# Patient Record
Sex: Female | Born: 1975 | Race: White | Hispanic: No | Marital: Married | Smoking: Never smoker
Health system: Southern US, Community
[De-identification: ages and names within clinical notes are randomized; demographics above are authoritative.]

## PROBLEM LIST (undated history)

## (undated) DIAGNOSIS — K900 Celiac disease: Secondary | ICD-10-CM

## (undated) DIAGNOSIS — E063 Autoimmune thyroiditis: Secondary | ICD-10-CM

## (undated) DIAGNOSIS — N809 Endometriosis, unspecified: Secondary | ICD-10-CM

## (undated) HISTORY — PX: SHOULDER ARTHROSCOPY: SHX128

## (undated) HISTORY — DX: Autoimmune thyroiditis: E06.3

## (undated) HISTORY — DX: Endometriosis, unspecified: N80.9

## (undated) HISTORY — PX: KNEE SURGERY: SHX244

## (undated) HISTORY — PX: LAPAROSCOPY: SHX197

## (undated) HISTORY — DX: Celiac disease: K90.0

## (undated) HISTORY — PX: NASAL SEPTUM SURGERY: SHX37

---

## 1996-07-14 HISTORY — PX: DIAGNOSTIC LAPAROSCOPY: SUR761

## 1998-01-24 ENCOUNTER — Ambulatory Visit (HOSPITAL_COMMUNITY): Admission: RE | Admit: 1998-01-24 | Discharge: 1998-01-24 | Payer: Self-pay | Admitting: Obstetrics and Gynecology

## 1999-01-28 ENCOUNTER — Other Ambulatory Visit: Admission: RE | Admit: 1999-01-28 | Discharge: 1999-01-28 | Payer: Self-pay | Admitting: Obstetrics and Gynecology

## 1999-11-07 ENCOUNTER — Other Ambulatory Visit: Admission: RE | Admit: 1999-11-07 | Discharge: 1999-11-07 | Payer: Self-pay | Admitting: Obstetrics and Gynecology

## 2000-11-09 ENCOUNTER — Other Ambulatory Visit: Admission: RE | Admit: 2000-11-09 | Discharge: 2000-11-09 | Payer: Self-pay | Admitting: Obstetrics and Gynecology

## 2003-01-03 ENCOUNTER — Other Ambulatory Visit: Admission: RE | Admit: 2003-01-03 | Discharge: 2003-01-03 | Payer: Self-pay | Admitting: Obstetrics and Gynecology

## 2003-04-24 ENCOUNTER — Other Ambulatory Visit: Admission: RE | Admit: 2003-04-24 | Discharge: 2003-04-24 | Payer: Self-pay | Admitting: Obstetrics and Gynecology

## 2003-11-27 ENCOUNTER — Other Ambulatory Visit: Admission: RE | Admit: 2003-11-27 | Discharge: 2003-11-27 | Payer: Self-pay | Admitting: Obstetrics and Gynecology

## 2004-10-03 ENCOUNTER — Ambulatory Visit (HOSPITAL_COMMUNITY): Admission: RE | Admit: 2004-10-03 | Discharge: 2004-10-03 | Payer: Self-pay | Admitting: Obstetrics and Gynecology

## 2005-04-21 ENCOUNTER — Ambulatory Visit: Payer: Self-pay | Admitting: Internal Medicine

## 2005-07-03 ENCOUNTER — Ambulatory Visit: Payer: Self-pay | Admitting: Internal Medicine

## 2005-07-08 ENCOUNTER — Ambulatory Visit: Payer: Self-pay | Admitting: Internal Medicine

## 2005-07-08 ENCOUNTER — Ambulatory Visit: Payer: Self-pay | Admitting: Family Medicine

## 2005-08-26 ENCOUNTER — Other Ambulatory Visit: Admission: RE | Admit: 2005-08-26 | Discharge: 2005-08-26 | Payer: Self-pay | Admitting: Obstetrics and Gynecology

## 2005-10-27 ENCOUNTER — Encounter: Admission: RE | Admit: 2005-10-27 | Discharge: 2005-10-27 | Payer: Self-pay | Admitting: Obstetrics and Gynecology

## 2006-09-30 ENCOUNTER — Ambulatory Visit (HOSPITAL_BASED_OUTPATIENT_CLINIC_OR_DEPARTMENT_OTHER): Admission: RE | Admit: 2006-09-30 | Discharge: 2006-09-30 | Payer: Self-pay | Admitting: Otolaryngology

## 2006-09-30 ENCOUNTER — Encounter (INDEPENDENT_AMBULATORY_CARE_PROVIDER_SITE_OTHER): Payer: Self-pay | Admitting: Specialist

## 2007-04-16 DIAGNOSIS — J309 Allergic rhinitis, unspecified: Secondary | ICD-10-CM | POA: Insufficient documentation

## 2009-11-22 ENCOUNTER — Encounter: Admission: RE | Admit: 2009-11-22 | Discharge: 2009-11-22 | Payer: Self-pay | Admitting: *Deleted

## 2010-11-29 NOTE — Op Note (Signed)
NAME:  Christina Donaldson, Christina Donaldson              ACCOUNT NO.:  [id number]   MEDICAL RECORD NO.:  [id number]          PATIENT TYPE:  AMB   LOCATION:  SDC                           FACILITY:  [facility]   PHYSICIAN:  Malva Limes, M.D.    DATE OF BIRTH:  [birth year]   DATE OF PROCEDURE:  10/03/2004  DATE OF DISCHARGE:                                 OPERATIVE REPORT   PREOPERATIVE DIAGNOSES:  1.  Pelvic pain.  2.  Dyspareunia.  3.  History of endometriosis.   POSTOPERATIVE DIAGNOSES:  1.  Pelvic pain.  2.  Dyspareunia.  3.  History of endometriosis.  4.  Pelvic congestion syndrome.  5.  Minimal endometriosis.   PROCEDURE:  1.  Diagnostic laparoscopy.  2.  Cautery of minimal endometriosis.  3.  Laparoscopic uterosacral nerve ablation.   SURGEON:  Malva Limes, M.D.   ANESTHESIA:  General endotracheal.   ANESTHESIA:  Ancef 1 g.   ESTIMATED BLOOD LOSS:  Minimal.   COMPLICATIONS:  None.   SPECIMENS:  None.   DRAINS:  Red rubber catheter bladder.   FINDINGS:  The patient had a minimal amount of endometriosis on the left  pelvic sidewall, also in the right posterior cul-de-sac otherwise no  evidence of endometriosis was found. The patient had a very large dilated  infundibulopelvic ligament bilaterally consistent with pelvic congestion  syndrome. The uterus, fallopian tubes and ovaries all appear to be normal.   DESCRIPTION OF PROCEDURE:  The patient was taken to the operating room where  she was placed in the dorsal supine position. A general anesthetic was  administered without complications. She was then placed in dorsal lithotomy  position, her bladder was drained with a red rubber catheter. Her umbilicus  was then injected with 0.25% Marcaine, the hulka tenaculum was applied to  the anterior cervical lip. A vertical skin incision was made through the  anterior cervical lip. A vertical skin incision was made through the  previous scar, the fascia was grasped, entered the Mayo's,  parietoperitoneum  was entered sharply, sutures were placed bilaterally and then the Hasson  tenaculum placed into the peritoneal cavity. Three liters of carbon dioxide  was then insufflated. The patient was placed in Trendelenburg, a 5 mm port  was placed in the suprapubic region under direct visualization. At this  point, the liver and gallbladder were examined and appeared to be normal,  the appendix was normal and the other findings were as noted above. At this  point, the Kleppinger was set up and areas of endometriosis all cauterized.  Because of the minimal amount of endometriosis and the patient's complaints,  it was felt that she might benefit from a laparoscopic uterosacral nerve  ablation and this was performed with the Kleppinger forceps. Following this,  a thorough examination was performed everywhere and no evidence of any other  endometriosis was noted. This concluded the procedure. At this point, the  instruments removed, pneumoperitoneum released, the fascia was closed with  interrupted #0 Vicryl suture, the skin closed interrupted 4-0 Vicryl suture.  The patient was taken to the recovery room in stable condition.  Instrument  and lap counts were correct x2. The patient was sent home with Percocet to  take p.r.n.      MA/MEDQ  D:  10/03/2004  T:  10/03/2004  Job:  [id number]

## 2010-11-29 NOTE — Op Note (Signed)
Christina Donaldson, Christina Donaldson              ACCOUNT NO.:  [id number]   MEDICAL RECORD NO.:  [id number]          PATIENT TYPE:  AMB   LOCATION:  DSC                          FACILITY:  [facility]   PHYSICIAN:  Jefry H. Pollyann Kennedy, MD     DATE OF BIRTH:  27-May-1976   DATE OF PROCEDURE:  09/30/2006  DATE OF DISCHARGE:                               OPERATIVE REPORT   PREOPERATIVE DIAGNOSES:  1. Nasal septal deviation.  2. Inferior turbinate hypertrophy.  3. Right concha bullosa with obstruction.   POSTOPERATIVE DIAGNOSES:  1. Nasal septal deviation.  2. Inferior turbinate hypertrophy.  3. Right concha bullosa with obstruction.   PROCEDURE:  1. Nasal septoplasty.  2. Submucous resection inferior turbinates bilaterally.  3. Right concha bullosa resection.   SURGEON:  Jefry H. Pollyann Kennedy, MD   ANESTHESIA:  General endotracheal anesthesia was used.   COMPLICATIONS:  No complications.   ESTIMATED BLOOD LOSS:  Minimal.   REFERRING PHYSICIAN:  Dr. Fabian Sharp.   FINDINGS:  Significant leftward septal deviation with obstruction on the  left, large inferior turbinates bilaterally with bony hyperplasia, very  large concha bullosa right middle turbinate causing obstruction on the  right side.   HISTORY:  A 35 year old with a long history of chronic nasal obstruction  and sinus symptoms.  Risks, benefits, alternatives, complications of  procedure explained to the patient.  She seemed to understand and agreed  to surgery.   PROCEDURE:  The patient is a the operating room, placed the operating  table in supine position.  Following induction of general endotracheal  anesthesia the patient was prepped and draped in standard fashion.  Oxymetazoline spray was used preoperatively in the nasal cavities and  then throughout the case on pledgets.  Xylocaine with epinephrine was  infiltrated into the septum bilaterally, the columella, the inferior  turbinates and the superior and posterior attachments of the middle  turbinate on the right.  1. Nasal septoplasty.  A left hemitransfixion incision was created      with 15 scalpel and a mucoperichondrial flap was developed      posteriorly down the left side of the nasal septum.  The bony      cartilaginous junction was divided and a similar flap was developed      down the right side.  The entire ethmoid plate was deflected to the      left side causing the majority of the obstruction and this was      resected using an open Laren Boom rongeur.  The anterior half      of the vomer was also deflected to the left causing large spur and      this was resected as well.  The quadrangular cartilage was in good      position and was left intact.  The mucosal incision was      reapproximated with a chromic suture.  2. Submucous resection inferior turbinates bilaterally.  The leading      edge of the inferior turbinates was incised in a vertical fashion      with a 15 scalpel.  A Cottle elevator  was used to elevate mucosa      off bone and large fragments of bone were resected left side being      larger than the right.  Turbinate remnants were outfractured with a      Therapist, nutritional.  This greatly increased the inferior meatus airways      bilaterally.  3. Right concha bullosa resection.  Using a combination of the      endoscopic through cut forceps and turbinectomy scissors the      inferior two-thirds and anterior aspect of the middle turbinate was      resected.  There was a large air sinus contained within.  The      lateral lamella was resected all the way back to the ground lamella      in order to expose the infundibulum to prevent any postoperative      obstruction.  The medial lamella was resected back as far as      necessary in order to allow the septum to lie in a nice midline      position without touching the turbinate.  The nasal cavities were      suctioned of blood and secretions, packed with rolled up Telfa      coated with bacitracin.   Pharynx was suctioned as well.  The      patient was awakened, extubated, transferred to recovery in stable      condition.      Jefry H. Pollyann Kennedy, MD  Electronically Signed     JHR/MEDQ  D:  09/30/2006  T:  09/30/2006  Job:  [id number]   cc:   Neta Mends. Fabian Sharp, MD

## 2011-03-20 ENCOUNTER — Ambulatory Visit (HOSPITAL_BASED_OUTPATIENT_CLINIC_OR_DEPARTMENT_OTHER)
Admission: RE | Admit: 2011-03-20 | Discharge: 2011-03-20 | Disposition: A | Discharge status: Home / Self Care | Payer: 59 | Source: Ambulatory Visit | Attending: Orthopedic Surgery | Admitting: Orthopedic Surgery

## 2011-03-20 DIAGNOSIS — M6750 Plica syndrome, unspecified knee: Secondary | ICD-10-CM | POA: Insufficient documentation

## 2011-03-20 DIAGNOSIS — Z01812 Encounter for preprocedural laboratory examination: Secondary | ICD-10-CM | POA: Insufficient documentation

## 2011-03-20 DIAGNOSIS — M2340 Loose body in knee, unspecified knee: Secondary | ICD-10-CM | POA: Insufficient documentation

## 2011-03-20 LAB — POCT HEMOGLOBIN-HEMACUE: Hemoglobin: 12.2 g/dL (ref 12.0–15.0)

## 2011-03-25 NOTE — Op Note (Signed)
  NAME:  WANEDA, KLAMMER NO.:  [id number]  MEDICAL RECORD NO.:  [id number]  LOCATION:                                 FACILITY:  PHYSICIAN:  Loreta Ave, M.D.      DATE OF BIRTH:  DATE OF PROCEDURE:  03/20/2011 DATE OF DISCHARGE:                              OPERATIVE REPORT   PREOPERATIVE DIAGNOSIS:  Left knee symptomatic medial plica, question loose body.  POSTOPERATIVE DIAGNOSIS:  Left knee symptomatic medial plica with abrasive changes, medial femoral condyle.  Calcific loose body, anterior recess.  PROCEDURE:  Left knee exam under anesthesia, arthroscopy.  Excision of medial plica, partial synovectomy.  Chondroplasty, medial femoral condyle.  Excision of calcific loose fragments at the anterior aspect of the knee.  SURGEON:  Loreta Ave, M.D.  ASSISTANT:  Genene Churn. Barry Dienes, [place].  ANESTHESIA:  General.  BLOOD LOSS:  Minimal.  SPECIMENS:  None.  COMPLICATIONS:  None.  DRESSING:  Soft compressive.  PROCEDURE IN DETAIL:  The patient was brought to the operating room and placed on the operating table in supine position.  After adequate anesthesia was obtained, knee examined.  Good motion and stability.  Leg holder applied.  Leg prepped and draped in usual sterile fashion.  Two portals, one each medial lateral parapatellar.  Arthroscope was induced. Knee distended and inspected.  Intact articular cartilage throughout except for the margin medial femoral condyle, which was rubbed and abraded by a fibrotic medial plica and synovitis anteromedially.  Plica excised in its entirety with partial synovectomy.  Hemostasis with cautery.  Superficial chondroplasty of the condyle.  Medial meniscus, medial compartment, lateral meniscus, lateral compartment, cruciate ligaments, and patellofemoral tracking all looked good.  In the anterior aspect of the knee on the back of the fat pad, there was a calcific area of tissue that was completely removed with  shaver.  No other osseous loose bodies were seen.  The entire knee examined to be sure there were no other loose fragments.  I then looked fluoroscopically in the knee where we could see the calcific density on preop films and was gone after excision of the calcific material mentioned above.  Instruments and fluid removed.  Knee injected Depo- Medrol and Marcaine.  Portals closed with nylon.  Sterile compressive dressing applied.  Anesthesia reversed.  Brought to the room.  Tolerated surgery well.  No complications.     Loreta Ave, M.D.     DFM/MEDQ  D:  03/20/2011  T:  03/20/2011  Job:  [id number]  Electronically Signed by Mckinley Jewel M.D. on 03/25/2011 02:47:32 PM

## 2011-04-16 ENCOUNTER — Other Ambulatory Visit: Payer: Self-pay | Admitting: Obstetrics and Gynecology

## 2013-05-10 ENCOUNTER — Other Ambulatory Visit: Payer: Self-pay | Admitting: Obstetrics and Gynecology

## 2013-11-03 ENCOUNTER — Other Ambulatory Visit: Payer: Self-pay

## 2014-05-22 ENCOUNTER — Other Ambulatory Visit: Payer: Self-pay | Admitting: Obstetrics and Gynecology

## 2014-05-23 LAB — CYTOLOGY - PAP

## 2015-06-26 ENCOUNTER — Other Ambulatory Visit: Payer: Self-pay | Admitting: Obstetrics and Gynecology

## 2015-06-27 LAB — CYTOLOGY - PAP

## 2016-07-01 ENCOUNTER — Other Ambulatory Visit: Payer: Self-pay | Admitting: Obstetrics and Gynecology

## 2016-07-01 DIAGNOSIS — E039 Hypothyroidism, unspecified: Secondary | ICD-10-CM | POA: Insufficient documentation

## 2016-07-03 ENCOUNTER — Other Ambulatory Visit: Payer: Self-pay | Admitting: Obstetrics and Gynecology

## 2016-07-03 DIAGNOSIS — R928 Other abnormal and inconclusive findings on diagnostic imaging of breast: Secondary | ICD-10-CM

## 2016-07-03 LAB — CYTOLOGY - PAP

## 2016-07-11 ENCOUNTER — Ambulatory Visit
Admission: RE | Admit: 2016-07-11 | Discharge: 2016-07-11 | Disposition: A | Discharge status: Home / Self Care | Payer: BLUE CROSS/BLUE SHIELD | Source: Ambulatory Visit | Attending: Obstetrics and Gynecology | Admitting: Obstetrics and Gynecology

## 2016-07-11 ENCOUNTER — Ambulatory Visit
Admission: RE | Admit: 2016-07-11 | Discharge: 2016-07-11 | Disposition: A | Discharge status: Home / Self Care | Payer: Self-pay | Source: Ambulatory Visit | Attending: Obstetrics and Gynecology | Admitting: Obstetrics and Gynecology

## 2016-07-11 DIAGNOSIS — R928 Other abnormal and inconclusive findings on diagnostic imaging of breast: Secondary | ICD-10-CM

## 2017-07-22 DIAGNOSIS — K589 Irritable bowel syndrome without diarrhea: Secondary | ICD-10-CM | POA: Insufficient documentation

## 2017-11-20 IMAGING — MG 2D DIGITAL DIAGNOSTIC UNILATERAL LEFT MAMMOGRAM WITH CAD AND ADJ
4 series · 4 of 12 positions shown · non-contrast
Comparison: 07/01/2016 and earlier priors dated 05/22/2014 and
05/10/2013

CLINICAL DATA: Asymmetry in the medial left breast identified on
recent screening mammogram.

EXAM:
2D DIGITAL DIAGNOSTIC UNILATERAL LEFT MAMMOGRAM WITH CAD AND ADJUNCT
TOMO

[L CC]
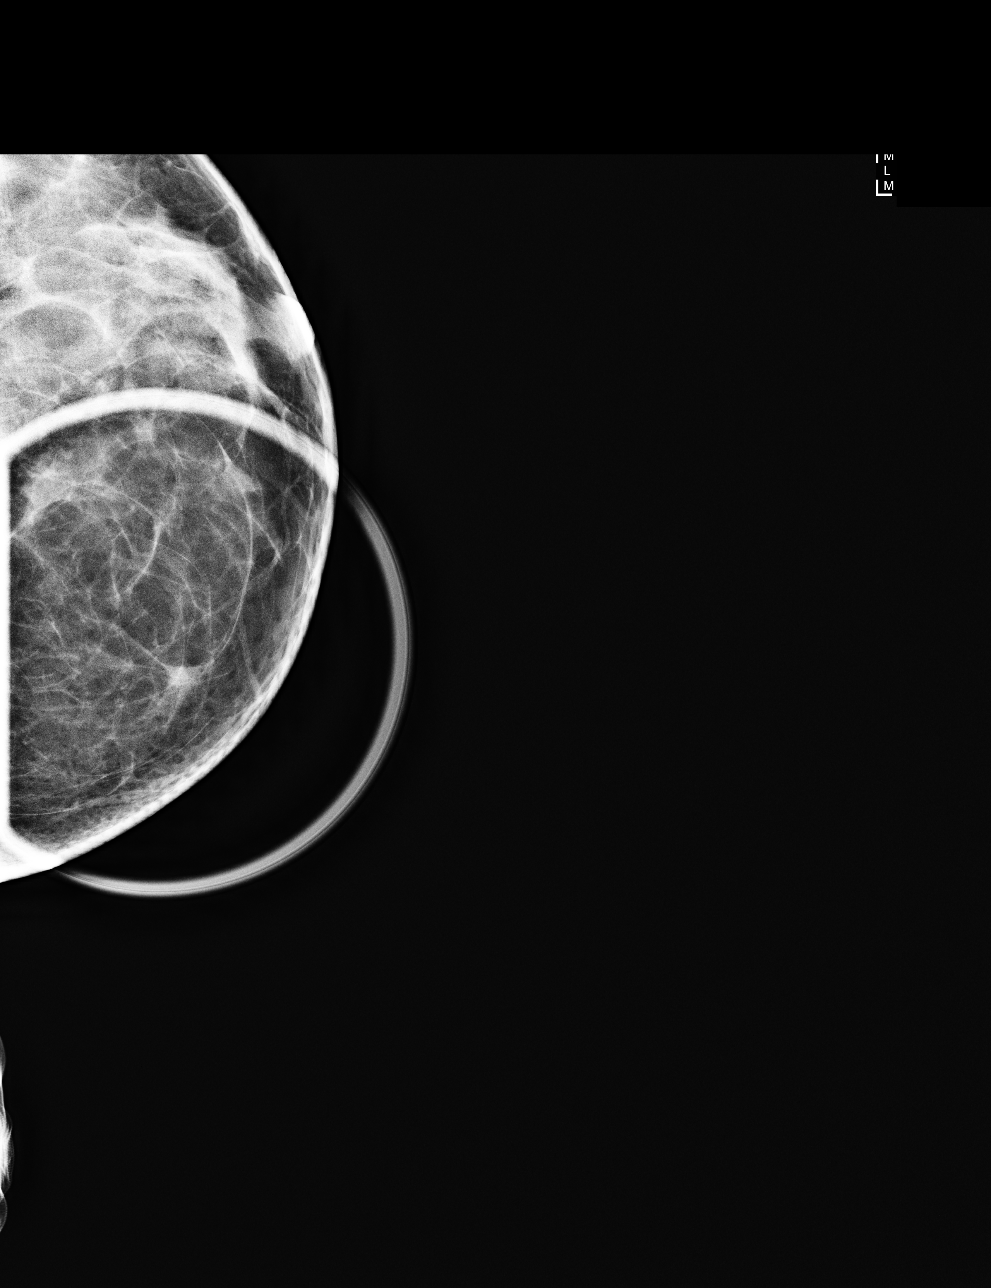

[L ML]
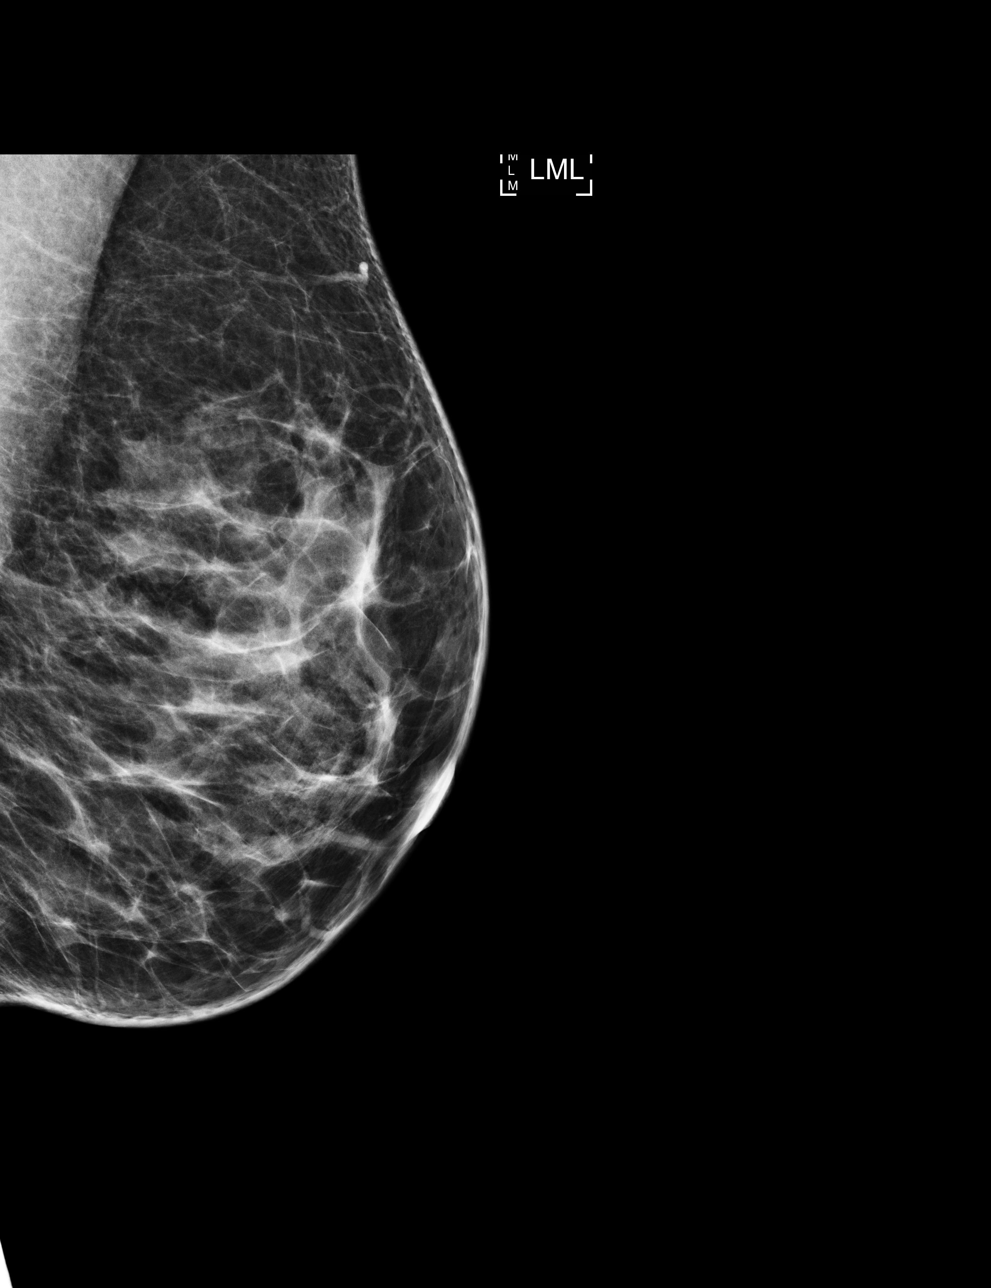

[L CC tomo · tomo slice 27/53.0]
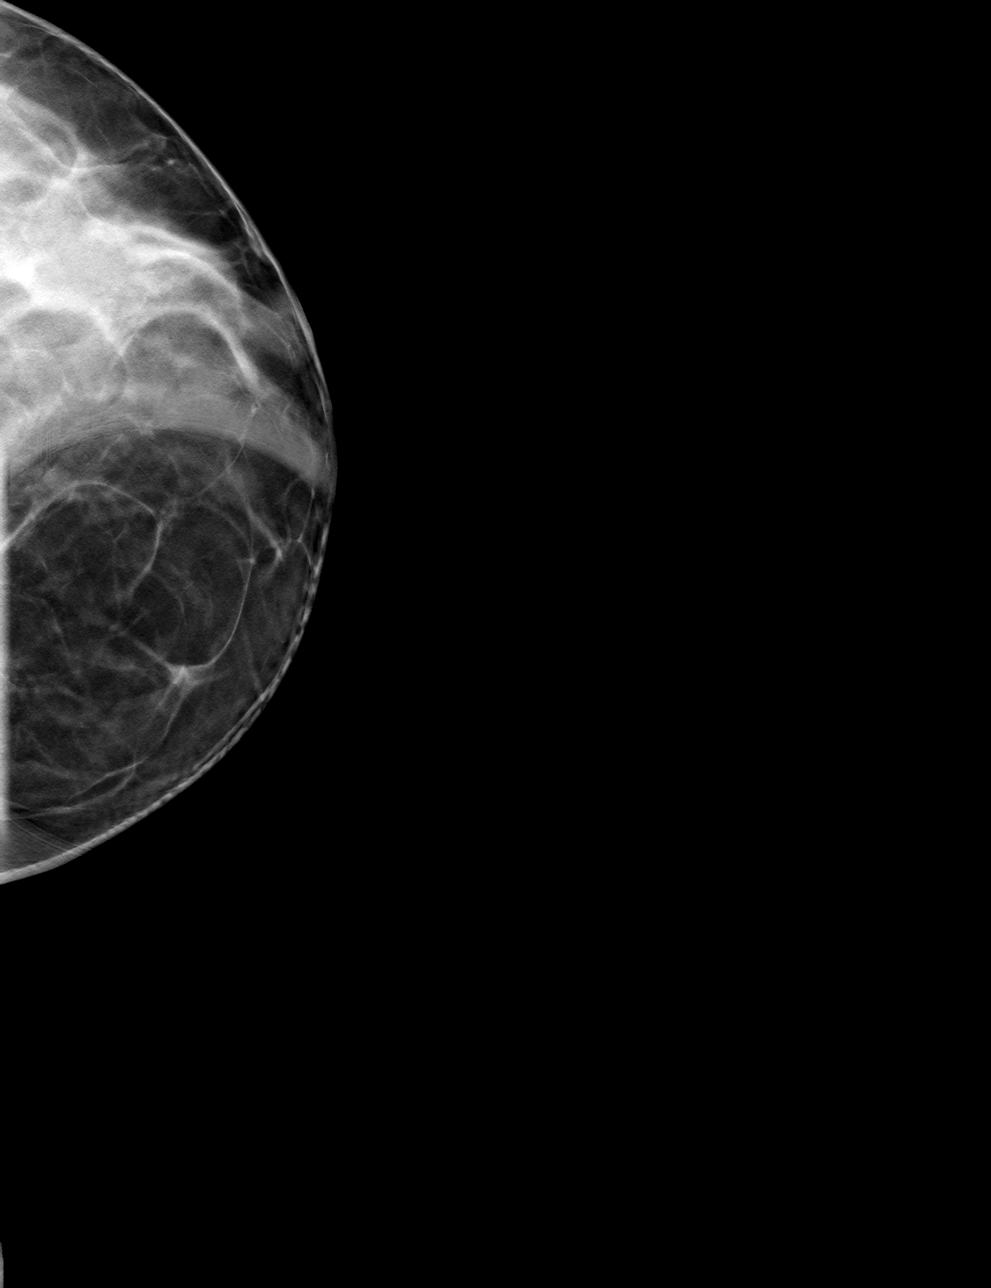

[L ML tomo · tomo slice 32/63.0]
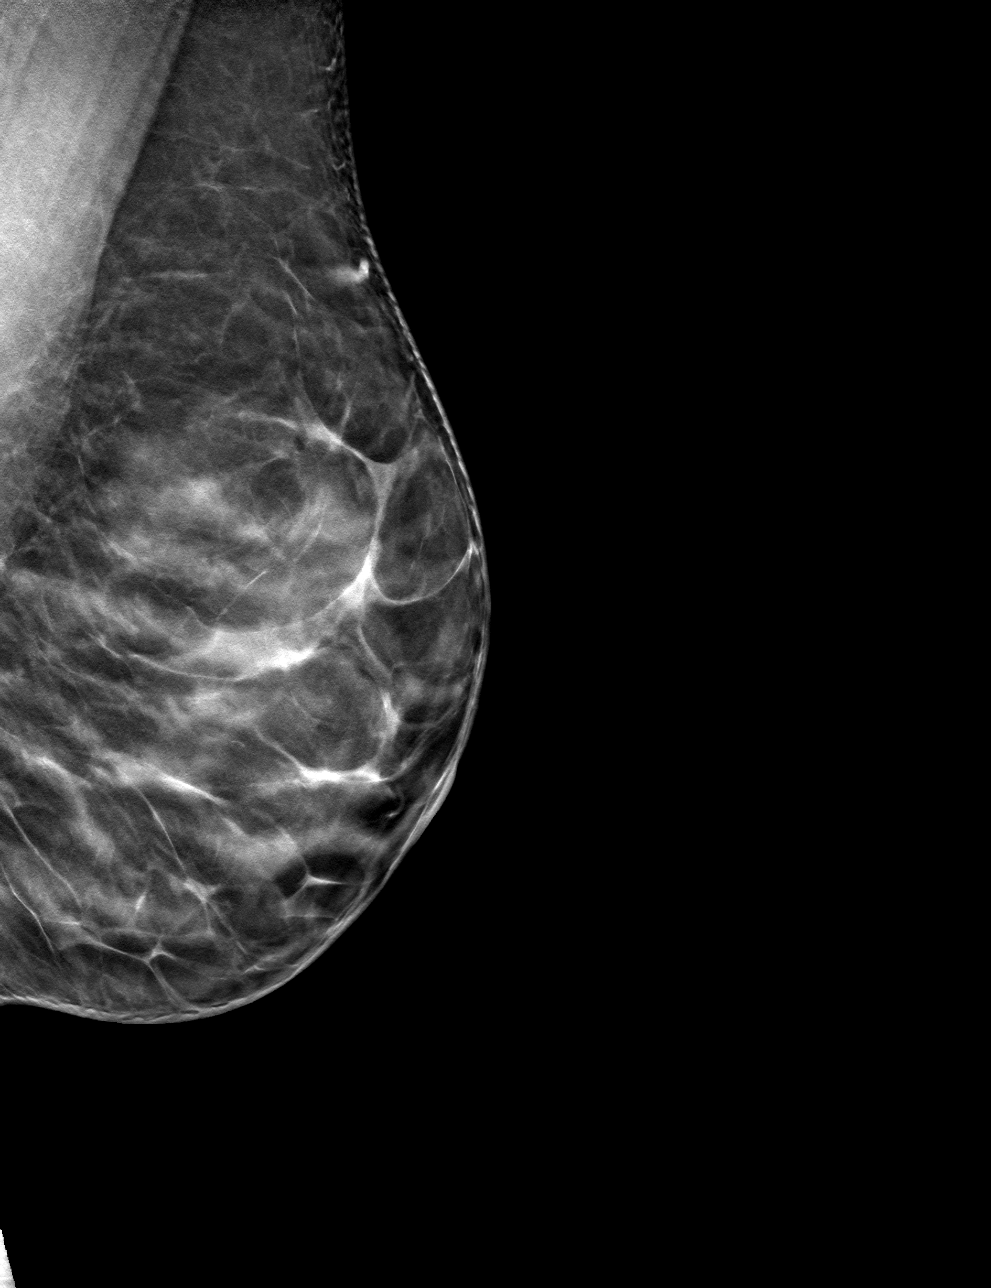

[4 of 12 positions shown; findings below may reference images not displayed]

ACR Breast Density Category c: The breast tissue is heterogeneously
dense, which may obscure small masses.
FINDINGS: Focal spot compression view of the medial left breast with
tomography and 90 degrees lateral view of the left breast with
tomography show normal fibroglandular densities without mass or
distortion. No findings to suggest malignancy.

Mammographic images were processed with CAD.
IMPRESSION: No evidence of malignancy in the left breast.

RECOMMENDATION:
Screening mammogram in one year.(Code:[id number]-[id number])

I have discussed the findings and recommendations with the patient.
Results were also provided in writing at the conclusion of the
visit. If applicable, a reminder letter will be sent to the patient
regarding the next appointment.

BI-RADS CATEGORY  1: Negative.

## 2018-08-09 ENCOUNTER — Other Ambulatory Visit: Payer: Self-pay | Admitting: Podiatry

## 2018-08-09 ENCOUNTER — Encounter: Payer: Self-pay | Admitting: Podiatry

## 2018-08-09 ENCOUNTER — Ambulatory Visit (INDEPENDENT_AMBULATORY_CARE_PROVIDER_SITE_OTHER): Payer: Managed Care, Other (non HMO) | Admitting: Podiatry

## 2018-08-09 ENCOUNTER — Ambulatory Visit (INDEPENDENT_AMBULATORY_CARE_PROVIDER_SITE_OTHER): Payer: Managed Care, Other (non HMO)

## 2018-08-09 VITALS — BP 111/69 | HR 79 | Resp 16

## 2018-08-09 DIAGNOSIS — M25572 Pain in left ankle and joints of left foot: Secondary | ICD-10-CM

## 2018-08-09 DIAGNOSIS — M79672 Pain in left foot: Principal | ICD-10-CM

## 2018-08-09 DIAGNOSIS — M779 Enthesopathy, unspecified: Secondary | ICD-10-CM

## 2018-08-09 DIAGNOSIS — M79671 Pain in right foot: Secondary | ICD-10-CM

## 2018-08-09 MED ORDER — TRIAMCINOLONE ACETONIDE 10 MG/ML IJ SUSP
10.0000 mg | Freq: Once | INTRAMUSCULAR | Status: AC
  Administered 2018-08-09: 10 mg

## 2018-08-09 NOTE — Progress Notes (Signed)
   Subjective:    Patient ID: Christina Donaldson, female    DOB: Dec 19, 1975, 43 y.o.   MRN: [id number]  HPI    Review of Systems  All other systems reviewed and are negative.      Objective:   Physical Exam        Assessment & Plan:

## 2018-08-10 ENCOUNTER — Telehealth: Payer: Self-pay | Admitting: Podiatry

## 2018-08-10 NOTE — Telephone Encounter (Signed)
Pt was seen yesterday and she was suppose to receive an anti-inflammatory medication at her pharmacy

## 2018-08-11 MED ORDER — DICLOFENAC SODIUM 75 MG PO TBEC: 75.0000 mg | DELAYED_RELEASE_TABLET | Freq: Two times a day (BID) | ORAL | 0 refills | Status: DC

## 2018-08-11 NOTE — Telephone Encounter (Signed)
voltaren

## 2018-08-11 NOTE — Telephone Encounter (Signed)
I informed pt of Dr. Regal's orders. 

## 2018-08-11 NOTE — Addendum Note (Signed)
Addended by: Alphia Kava D on: 08/11/2018 11:59 AM   Modules accepted: Orders

## 2018-08-11 NOTE — Progress Notes (Signed)
Subjective:   Patient ID: Christina Donaldson, female   DOB: 43 y.o.   MRN: [id number]   HPI Patient presents stating she is been having a lot of pain in her left ankle outside of her foot.  States that it is been hurting her for several months and is gradually worsened over that time it is difficult to wear shoe gear with comfortably.  Patient does not smoke and likes to be active   Review of Systems  All other systems reviewed and are negative.       Objective:  Physical Exam Vitals signs and nursing note reviewed.  Constitutional:      Appearance: She is well-developed.  Pulmonary:     Effort: Pulmonary effort is normal.  Musculoskeletal: Normal range of motion.  Skin:    General: Skin is warm.  Neurological:     Mental Status: She is alert.     Neurovascular status was found to be intact muscle strength is adequate range of motion within normal limits with exquisite discomfort in the sinus tarsi left outside of the left foot with pain around the head of the fifth metatarsal with fluid buildup around this area.  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  Patient will be seen back for [place] to recheck  X-rays indicate that there is no indications that there is an arthritic process here and it appears to  be inflammatory with mild depression of the arch

## 2018-08-26 ENCOUNTER — Encounter: Payer: Self-pay | Admitting: Podiatry

## 2018-08-26 NOTE — Telephone Encounter (Signed)
Pt sent MyChart message stating have adverse effects to Diclofenac.

## 2018-08-31 ENCOUNTER — Other Ambulatory Visit: Payer: Self-pay | Admitting: Podiatry

## 2018-09-06 ENCOUNTER — Encounter: Payer: Self-pay | Admitting: Podiatry

## 2018-09-06 ENCOUNTER — Ambulatory Visit (INDEPENDENT_AMBULATORY_CARE_PROVIDER_SITE_OTHER): Payer: Managed Care, Other (non HMO) | Admitting: Podiatry

## 2018-09-06 DIAGNOSIS — M779 Enthesopathy, unspecified: Secondary | ICD-10-CM | POA: Diagnosis not present

## 2018-09-06 DIAGNOSIS — M25572 Pain in left ankle and joints of left foot: Secondary | ICD-10-CM

## 2018-09-06 NOTE — Progress Notes (Signed)
Subjective:   Patient ID: Christina Donaldson, female   DOB: 43 y.o.   MRN: [id number]   HPI Patient presents stating the ankle is improved but I continue to develop pain in my forefoot of both feet and also around the bones on the outside at times can be tender   ROS      Objective:  Physical Exam  Neurovascular status intact with patient having nondescript forefoot pain bilateral and mild discomfort around the fifth metatarsal head bilateral with diminished discomfort in the sinus tarsi     Assessment:  Inflammatory condition that is improving but patient continues to have overall foot pain     Plan:  H&P reviewed condition and I have recommended orthotics to try to redistribute the weightbearing patterns of both feet.  I do think that the ped orthotist should evaluate her along with her shoe gear choices to make a decision at what will be best but I do think a softer type device to try to redistribute weight will be best for her.  I advised and discussed this with Elynor at great length and she is scheduled for this to be done

## 2018-09-29 ENCOUNTER — Other Ambulatory Visit: Payer: Self-pay

## 2018-09-29 ENCOUNTER — Ambulatory Visit: Payer: Managed Care, Other (non HMO) | Admitting: Orthotics

## 2018-09-29 DIAGNOSIS — M7751 Other enthesopathy of right foot: Secondary | ICD-10-CM

## 2018-09-29 DIAGNOSIS — M25572 Pain in left ankle and joints of left foot: Secondary | ICD-10-CM

## 2018-09-29 DIAGNOSIS — M79671 Pain in right foot: Secondary | ICD-10-CM

## 2018-09-29 DIAGNOSIS — M216X2 Other acquired deformities of left foot: Secondary | ICD-10-CM | POA: Diagnosis not present

## 2018-09-29 DIAGNOSIS — M7752 Other enthesopathy of left foot: Secondary | ICD-10-CM

## 2018-09-29 DIAGNOSIS — M216X1 Other acquired deformities of right foot: Secondary | ICD-10-CM

## 2018-09-29 DIAGNOSIS — M779 Enthesopathy, unspecified: Secondary | ICD-10-CM

## 2018-09-29 DIAGNOSIS — M79672 Pain in left foot: Secondary | ICD-10-CM

## 2018-09-29 NOTE — Progress Notes (Signed)
Patient came into today to be cast for [employer]. Upon recommendation of Dr. Charlsie Merles  Patient presents with moderatly high arch, lateral discomfort L > R, and evidence of Sinus Tarsi syndrome. Goals are offer semi rigid device with arch support as well as wide base and lateral valgus wedging.  Plan vendor [place]

## 2018-10-21 ENCOUNTER — Other Ambulatory Visit: Payer: Managed Care, Other (non HMO) | Admitting: Orthotics

## 2018-10-26 ENCOUNTER — Encounter: Payer: Self-pay | Admitting: Podiatry

## 2018-10-28 ENCOUNTER — Ambulatory Visit: Payer: Managed Care, Other (non HMO) | Admitting: Orthotics

## 2018-10-28 ENCOUNTER — Other Ambulatory Visit: Payer: Self-pay

## 2018-10-28 DIAGNOSIS — M779 Enthesopathy, unspecified: Secondary | ICD-10-CM

## 2018-10-28 DIAGNOSIS — M79671 Pain in right foot: Secondary | ICD-10-CM

## 2018-10-28 DIAGNOSIS — M25572 Pain in left ankle and joints of left foot: Secondary | ICD-10-CM

## 2018-10-28 DIAGNOSIS — M79672 Pain in left foot: Secondary | ICD-10-CM

## 2018-10-28 NOTE — Progress Notes (Signed)
Trimmed down to go in [facility].

## 2019-04-25 ENCOUNTER — Encounter: Payer: Self-pay | Admitting: Neurology

## 2019-04-26 ENCOUNTER — Ambulatory Visit (INDEPENDENT_AMBULATORY_CARE_PROVIDER_SITE_OTHER): Payer: Managed Care, Other (non HMO) | Admitting: Neurology

## 2019-04-26 ENCOUNTER — Other Ambulatory Visit: Payer: Self-pay

## 2019-04-26 ENCOUNTER — Encounter: Payer: Self-pay | Admitting: Neurology

## 2019-04-26 VITALS — BP 135/86 | HR 68 | Temp 97.4°F | Ht 63.0 in | Wt 162.0 lb

## 2019-04-26 DIAGNOSIS — R253 Fasciculation: Secondary | ICD-10-CM | POA: Diagnosis not present

## 2019-04-26 DIAGNOSIS — K582 Mixed irritable bowel syndrome: Secondary | ICD-10-CM

## 2019-04-26 DIAGNOSIS — E038 Other specified hypothyroidism: Secondary | ICD-10-CM | POA: Diagnosis not present

## 2019-04-26 DIAGNOSIS — G4761 Periodic limb movement disorder: Secondary | ICD-10-CM | POA: Diagnosis not present

## 2019-04-26 DIAGNOSIS — G478 Other sleep disorders: Secondary | ICD-10-CM

## 2019-04-26 NOTE — Progress Notes (Signed)
SLEEP MEDICINE CLINIC    Provider:  Melvyn Novas, MD  Primary Care Physician:  [facility] [address] [place] [address]     Referring Provider: [facility], [place], [place]   Richardean Chimera, Md [address] [place],  [place] [address]          Chief Complaint according to patient   Patient presents with:    . New Patient (Initial Visit)           HISTORY OF PRESENT ILLNESS:  Christina Donaldson is a 43 y.o. year old  Caucasian female patient seen on 04/26/2019 .   Chief concern according to patient : The patient reports daytime leg sensation, little shocks and pins and needles.  By nighttime she has twitching in her legs and once hit herself in the face. Her husband sleeps on the couch. Starting 2-3 month ago. No known trigger.    I have the pleasure of seeing Christina Donaldson today, a right -handed Caucasian female with a possible sleep disorder.  She has a history of endometriosis, knee arthroscopy, left knee, nasal septal deviation surgery.     Family medical /sleep history: no other family member with OSA, insomnia, nor sleep walkers.    Social history: Patient is working as an Airline pilot 3/ week, and lives in a household with 3 persons. Family status is married , with one daughter in high school.  The patient currently works part time from home. Pets- one dog is present. Tobacco use- never .  ETOH use: rarely, Caffeine intake in form of Coffee( 1 cup in AM ) Soda( none ) Tea (1/ week ) or energy drinks. Regular exercise - [employer] 2/ weekly.    Sleep habits are as follows: The patient's dinner time is between 6- 8 PM. The patient goes to bed at 10.30 PM and is asleep promptly. She continues to sleep for many hours, she does not wake for  bathroom breaks,and not from " twitching". The preferred sleep position is lateral right , with the support of one pillows.  Dreams are reportedly frequent, sometimes vivid.Marland Kitchen 5.30  AM is the usual rise time.  The  patient wakes up spontaneously between 7.30- 8 AM  She reports not feeling refreshed or restored in AM, with symptoms such as  morning headaches and achiness, residual fatigue.  Naps are taken infrequently, as they are less refreshing than nocturnal sleep.    Review of Systems: Out of a complete 14 system review, the patient complains of only the following symptoms, and all other reviewed systems are negative.:  Fatigue, sleepiness- " I am not aware of my twitching."   How likely are you to doze in the following situations: 0 = not likely, 1 = slight chance, 2 = moderate chance, 3 = high chance   Sitting and Reading? Watching Television? Sitting inactive in a public place (theater or meeting)? As a passenger in a car for an hour without a break? Lying down in the afternoon when circumstances permit? 3 Sitting and talking to someone? Sitting quietly after lunch without alcohol? In a car, while stopped for a few minutes in traffic?   Total = 12/ 24 points   FSS endorsed at 39/ 63 points.   Social History   Socioeconomic History  . Marital status: Married    Spouse name: Not on file  . Number of children: Not on file  . Years of education: Not on file  .  Highest education level: Not on file  Occupational History  . Not on file  Social Needs  . Financial resource strain: Not on file  . Food insecurity    Worry: Not on file    Inability: Not on file  . Transportation needs    Medical: Not on file    Non-medical: Not on file  Tobacco Use  . Smoking status: Never Smoker  . Smokeless tobacco: Never Used  Substance and Sexual Activity  . Alcohol use: Yes    Comment: occ  . Drug use: Not Currently  . Sexual activity: Not on file  Lifestyle  . Physical activity    Days per week: Not on file    Minutes per session: Not on file  . Stress: Not on file  Relationships  . Social Herbalist on phone: Not on file    Gets together: Not on file    Attends religious  service: Not on file    Active member of club or organization: Not on file    Attends meetings of clubs or organizations: Not on file    Relationship status: Not on file  Other Topics Concern  . Not on file  Social History Narrative  . Not on file     Current Outpatient Medications on File Prior to Visit  Medication Sig Dispense Refill  . levonorgestrel (MIRENA, 52 MG,) 20 MCG/24HR IUD Mirena 20 mcg/24 hours (5 yrs) 52 mg intrauterine device  Take 1 device by intrauterine route as directed.    Marland Kitchen levothyroxine (SYNTHROID) 100 MCG tablet Take 100 mcg by mouth daily before breakfast.    . loratadine (CLARITIN) 10 MG tablet Take 10 mg by mouth daily.     No current facility-administered medications on file prior to visit.     Allergies  Allergen Reactions  . Diclofenac Palpitations and Other (See Comments)    Visual changes; fascial twitches  . Latex Rash    Physical exam:  Today's Vitals   04/26/19 0851  BP: 135/86  Pulse: 68  Temp: (!) 97.4 F (36.3 C)  Weight: 162 lb (73.5 kg)  Height: 5\' 3"  (1.6 m)   Body mass index is 28.7 kg/m.   Wt Readings from Last 3 Encounters:  04/26/19 162 lb (73.5 kg)     Ht Readings from Last 3 Encounters:  04/26/19 5\' 3"  (1.6 m)      General: The patient is awake, alert and appears not in acute distress. The patient is well groomed. Head: Normocephalic, atraumatic. Neck is supple. Mallampati 2,  neck circumference:13 inches . Nasal airflow patent.  Retrognathia is not seen.  Dental status: intact  Cardiovascular:  Regular rate and cardiac rhythm by pulse,  without distended neck veins. Respiratory: Lungs are clear to auscultation.  Skin:  Without evidence of ankle edema, or rash. Trunk: The patient's posture is erect.   Neurologic exam : The patient is awake and alert, oriented to place and time.   Memory subjective described as intact.  Attention span & concentration ability appears normal.  Speech is fluent,  without  dysarthria, dysphonia or aphasia.  Mood and affect are appropriate.   Cranial nerves: no loss of smell or taste reported  Pupils are equal and briskly reactive to light. Funduscopic exam deferred. .  Extraocular movements in vertical and horizontal planes were intact and without nystagmus. No Diplopia. Visual fields by finger perimetry are intact. Hearing was intact to soft voice and finger rubbing.    Facial  sensation intact to fine touch.  Facial motor strength is symmetric and tongue and uvula move midline.  Neck ROM : rotation, tilt and flexion extension were normal for age and shoulder shrug was symmetrical.    Motor exam:  Symmetric bulk, tone and ROM.   Normal tone without cog wheeling, symmetric grip strength .   Sensory:  Fine touch and vibration were normal.  Proprioception tested in the upper extremities was normal.   Coordination: Rapid alternating movements in the fingers/hands were of normal speed.  The Finger-to-nose maneuver was intact without evidence of ataxia, dysmetria or tremor.   Gait and station: Patient could rise unassisted from a seated position, walked without assistive device.  Stance is of normal width. .  Toe and heel walk were deferred.  Deep tendon reflexes: in the  upper and lower extremities are symmetric and intact.  Babinski response was deferred .       After spending a total time of  35  minutes face to face and additional time for physical and neurologic examination, review of laboratory studies,  personal review of imaging studies, reports and results of other testing and review of referral information / records as far as provided in visit, I have established the following assessments:  Christina Donaldson does not have RLS, there is no irrestible urge to move.  Her feet hurt when she takes her first steps after sleeping or sitting. The lateral edge of the feet is most sensitive.  This is not likely neuropathic. She takes vitamin complex super B ,  biotin, vit C, fish oil.  Healthy moderate balanced diet.  Arms have the same tingling but less frequent. No loss of strength.    1)  I can only offer a sleep study with video to capture the described activity and correlate to EEG changes, NCV would not likely yield results, electrolyte and vitamin abnormalities are unlikely, too . D/C Requip.    My Plan is to proceed with: Parasomnia montage PSG.   I would like to thank [facility] and Richardean ChimeraDaniel, Terry G, Md [address] [place],  [place] [id number] for allowing me to meet with and to take care of this pleasant patient.   II plan to follow up either personally  within 2-3  month.   CC: I will share my notes with PCP   Electronically signed by: Melvyn Novasarmen Elantra Caprara, MD 04/26/2019 9:11 AM  [facility] and [facility] Board certified by The [employer] of Sleep Medicine and Diplomate of the [employer] of [employer] Medicine. Board certified In Neurology through the ABPN, Fellow of the [employer] of Neu[employer]. Medical Director of [facility].

## 2019-04-27 ENCOUNTER — Telehealth: Payer: Self-pay

## 2019-04-27 LAB — COMPREHENSIVE METABOLIC PANEL
ALT: 23 IU/L (ref 0–32)
AST: 18 IU/L (ref 0–40)
Albumin/Globulin Ratio: 2 (ref 1.2–2.2)
Albumin: 4.4 g/dL (ref 3.8–4.8)
Alkaline Phosphatase: 65 IU/L (ref 39–117)
BUN/Creatinine Ratio: 18 (ref 9–23)
BUN: 14 mg/dL (ref 6–24)
Bilirubin Total: <0.2 mg/dL (ref 0.0–1.2)
CO2: 25 mmol/L (ref 20–29)
Calcium: 9.4 mg/dL (ref 8.7–10.2)
Chloride: 104 mmol/L (ref 96–106)
Creatinine, Ser: 0.8 mg/dL (ref 0.57–1.00)
GFR calc Af Amer: 104 mL/min/{1.73_m2} (ref >59)
GFR calc non Af Amer: 91 mL/min/{1.73_m2} (ref >59)
Globulin, Total: 2.2 g/dL (ref 1.5–4.5)
Glucose: 87 mg/dL (ref 65–99)
Potassium: 4.3 mmol/L (ref 3.5–5.2)
Sodium: 141 mmol/L (ref 134–144)
Total Protein: 6.6 g/dL (ref 6.0–8.5)

## 2019-04-27 LAB — CBC WITH DIFFERENTIAL/PLATELET
Basophils Absolute: 0 10*3/uL (ref 0.0–0.2)
Basos: 1 %
EOS (ABSOLUTE): 0.1 10*3/uL (ref 0.0–0.4)
Eos: 2 %
Hematocrit: 40.4 % (ref 34.0–46.6)
Hemoglobin: 13.6 g/dL (ref 11.1–15.9)
Immature Grans (Abs): 0 10*3/uL (ref 0.0–0.1)
Immature Granulocytes: 0 %
Lymphocytes Absolute: 1.4 10*3/uL (ref 0.7–3.1)
Lymphs: 29 %
MCH: 28.8 pg (ref 26.6–33.0)
MCHC: 33.7 g/dL (ref 31.5–35.7)
MCV: 85 fL (ref 79–97)
Monocytes Absolute: 0.4 10*3/uL (ref 0.1–0.9)
Monocytes: 9 %
Neutrophils Absolute: 2.8 10*3/uL (ref 1.4–7.0)
Neutrophils: 59 %
Platelets: 279 10*3/uL (ref 150–450)
RBC: 4.73 x10E6/uL (ref 3.77–5.28)
RDW: 13.3 % (ref 11.7–15.4)
WBC: 4.6 10*3/uL (ref 3.4–10.8)

## 2019-04-27 NOTE — Telephone Encounter (Signed)
I called pt, left a detailed message on her cell phone number, per DPR, advising her that Dr. Brett Fairy reviewed her lab results and said they were normal. I asked her to call us back with questions or concerns.

## 2019-04-27 NOTE — Telephone Encounter (Signed)
-----   Message from Larey Seat, MD sent at 04/27/2019  3:52 PM EDT ----- Normal metabolic panel.

## 2019-05-02 ENCOUNTER — Encounter: Payer: Self-pay | Admitting: Neurology

## 2019-05-04 ENCOUNTER — Other Ambulatory Visit: Payer: Self-pay

## 2019-05-04 DIAGNOSIS — Z20822 Contact with and (suspected) exposure to covid-19: Secondary | ICD-10-CM

## 2019-05-05 LAB — NOVEL CORONAVIRUS, NAA: SARS-CoV-2, NAA: NOT DETECTED

## 2019-05-06 ENCOUNTER — Other Ambulatory Visit: Payer: Self-pay | Admitting: *Deleted

## 2019-05-06 DIAGNOSIS — Z20822 Contact with and (suspected) exposure to covid-19: Secondary | ICD-10-CM

## 2019-05-06 NOTE — Telephone Encounter (Signed)
Our goal is testing for organic sleep disorders, confirmation of a diagnosis, and initiating treatment.  Sometimes, no organic disorder is found.  I leave it up to the patient if she wants to be followed here, or rather look at holistic, naturopathic therapies ( if necessary).

## 2019-05-07 LAB — NOVEL CORONAVIRUS, NAA: SARS-CoV-2, NAA: NOT DETECTED

## 2019-08-10 ENCOUNTER — Other Ambulatory Visit: Payer: Self-pay

## 2019-08-10 ENCOUNTER — Ambulatory Visit: Payer: Managed Care, Other (non HMO) | Attending: Internal Medicine

## 2019-08-10 DIAGNOSIS — Z20822 Contact with and (suspected) exposure to covid-19: Secondary | ICD-10-CM

## 2019-08-11 LAB — NOVEL CORONAVIRUS, NAA: SARS-CoV-2, NAA: NOT DETECTED

## 2020-03-08 ENCOUNTER — Other Ambulatory Visit: Payer: Self-pay

## 2020-03-08 ENCOUNTER — Other Ambulatory Visit: Payer: Managed Care, Other (non HMO)

## 2020-03-08 DIAGNOSIS — Z20822 Contact with and (suspected) exposure to covid-19: Secondary | ICD-10-CM

## 2020-03-09 LAB — SARS-COV-2, NAA 2 DAY TAT

## 2020-03-09 LAB — NOVEL CORONAVIRUS, NAA: SARS-CoV-2, NAA: NOT DETECTED

## 2021-07-12 ENCOUNTER — Other Ambulatory Visit: Payer: Self-pay | Admitting: Obstetrics and Gynecology

## 2021-07-12 DIAGNOSIS — Z1231 Encounter for screening mammogram for malignant neoplasm of breast: Secondary | ICD-10-CM

## 2021-08-22 ENCOUNTER — Other Ambulatory Visit: Payer: Self-pay | Admitting: Obstetrics and Gynecology

## 2021-08-22 DIAGNOSIS — Z1231 Encounter for screening mammogram for malignant neoplasm of breast: Secondary | ICD-10-CM

## 2021-09-06 ENCOUNTER — Other Ambulatory Visit: Payer: Self-pay

## 2021-09-06 ENCOUNTER — Ambulatory Visit
Admission: RE | Admit: 2021-09-06 | Discharge: 2021-09-06 | Disposition: A | Discharge status: Home / Self Care | Payer: 59 | Source: Ambulatory Visit

## 2021-09-06 DIAGNOSIS — Z1231 Encounter for screening mammogram for malignant neoplasm of breast: Secondary | ICD-10-CM

## 2022-08-12 ENCOUNTER — Other Ambulatory Visit: Payer: Self-pay | Admitting: Obstetrics and Gynecology

## 2022-08-12 DIAGNOSIS — Z1231 Encounter for screening mammogram for malignant neoplasm of breast: Secondary | ICD-10-CM

## 2022-09-29 ENCOUNTER — Ambulatory Visit
Admission: RE | Admit: 2022-09-29 | Discharge: 2022-09-29 | Disposition: A | Discharge status: Home / Self Care | Payer: No Typology Code available for payment source | Source: Ambulatory Visit

## 2022-09-29 DIAGNOSIS — Z1231 Encounter for screening mammogram for malignant neoplasm of breast: Secondary | ICD-10-CM

## 2023-03-11 ENCOUNTER — Ambulatory Visit (INDEPENDENT_AMBULATORY_CARE_PROVIDER_SITE_OTHER): Payer: No Typology Code available for payment source | Admitting: Obstetrics & Gynecology

## 2023-03-11 ENCOUNTER — Encounter: Payer: Self-pay | Admitting: Obstetrics & Gynecology

## 2023-03-11 ENCOUNTER — Other Ambulatory Visit (HOSPITAL_COMMUNITY)
Admission: RE | Admit: 2023-03-11 | Discharge: 2023-03-11 | Disposition: A | Discharge status: Home / Self Care | Payer: No Typology Code available for payment source | Source: Ambulatory Visit | Attending: Obstetrics & Gynecology | Admitting: Obstetrics & Gynecology

## 2023-03-11 VITALS — BP 130/88 | HR 84 | Ht 63.0 in | Wt 144.0 lb

## 2023-03-11 DIAGNOSIS — Z975 Presence of (intrauterine) contraceptive device: Secondary | ICD-10-CM

## 2023-03-11 DIAGNOSIS — Z01419 Encounter for gynecological examination (general) (routine) without abnormal findings: Secondary | ICD-10-CM | POA: Diagnosis present

## 2023-03-11 DIAGNOSIS — Z8742 Personal history of other diseases of the female genital tract: Secondary | ICD-10-CM | POA: Diagnosis not present

## 2023-03-11 NOTE — Progress Notes (Addendum)
WELL-WOMAN EXAMINATION Patient name: Christina Donaldson MRN [id number]  Date of birth: Dec 12, 1975 Chief Complaint:   Gynecologic Exam  History of Present Illness:   Christina Donaldson is a 47 y.o. [phone]  female being seen today for a routine well-woman exam.   Previously seen in [facility] by Dr. Dareen Piano.  Notes Mirena for contraception and for management of endometriosis.  On occasion may have a period- very sporadic and very light.  Denies issues with pelvic pain, dyspareunia or irregular bleeding.  Denies vaginal discharge, itching or irritation.  She notes occasional stress incontinence when she is sick or has a bad cough.  Sister with breast Ca.  BRCA screening already completed per pt @ [facility]   No LMP recorded. (Menstrual status: IUD).  The current method of family planning is IUD- Mirena   Endometriosis- diagnosed by laparoscopy (prior procedures x 2)    Last pap - to be done.  Last mammogram: 09/2022. Last colonoscopy: up to date     03/11/2023    8:32 AM  Depression screen PHQ 2/9  Decreased Interest 0  Down, Depressed, Hopeless 0  PHQ - 2 Score 0  Altered sleeping 1  Tired, decreased energy 1  Change in appetite 1  Feeling bad or failure about yourself  0  Trouble concentrating 1  Moving slowly or fidgety/restless 0  Suicidal thoughts 0  PHQ-9 Score 4      Review of Systems:   Pertinent items are noted in HPI Denies any headaches, blurred vision, fatigue, shortness of breath, chest pain, abdominal pain, bowel movements, urination, or intercourse unless otherwise stated above.  Pertinent History Reviewed:  Reviewed past medical,surgical, social and family history.  Reviewed problem list, medications and allergies. Physical Assessment:   Vitals:   03/11/23 0835  BP: 130/88  Pulse: 84  Weight: 144 lb (65.3 kg)  Height: 5\' 3"  (1.6 m)  Body mass index is 25.51 kg/m.        Physical Examination:   General appearance - well appearing, and in  no distress  Mental status - alert, oriented to person, place, and time  Psych:  She has a normal mood and affect  Skin - warm and dry, normal color, no suspicious lesions noted  Chest - effort normal, all lung fields clear to auscultation bilaterally  Heart - normal rate and regular rhythm  Neck:  midline trachea, no thyromegaly or nodules  Breasts - breasts appear normal, no suspicious masses, no skin or nipple changes or  axillary nodes  Abdomen - soft, nontender, nondistended, no masses or organomegaly  Pelvic - VULVA: normal appearing vulva with no masses, tenderness or lesions  VAGINA: normal appearing vagina with normal color and discharge, no lesions  CERVIX: normal appearing cervix without discharge or lesions, no CMT strings NOT visualized  Thin prep pap is done with HR HPV cotesting  UTERUS: uterus is felt to be normal size, shape, consistency and nontender   ADNEXA: No adnexal masses or tenderness noted.  Extremities:  No swelling or varicosities noted  Chaperone: Faith Rogue     Assessment & Plan:  1) Well-Woman Exam -pap collected, reviewed ASCCP guidelines -mammogram and colon cancer screening up to date  2) IUD in place- strings not seen Per pt- Dr. Dareen Piano always trims strings very short Device in place for endometriosis management and doing well  3) Stress incontinence Reviewed conservative management including PT if interested  Meds: No orders of the defined types were placed in this  encounter.   Follow-up: Return in about 1 year (around 03/10/2024) for Annual []  request records- [facility].  OLD RECORDS OBTAINED: -laparoscopy for endometriosis performed 1999, 2006 -Mirena placed 10/29/2016 -Last pap 08/2020 negative  Myna Hidalgo, DO Attending Obstetrician & Gynecologist, [facility] for [facility], [facility]

## 2023-03-19 LAB — CYTOLOGY - PAP
Comment: NEGATIVE
Diagnosis: NEGATIVE
High risk HPV: NEGATIVE

## 2023-09-15 ENCOUNTER — Other Ambulatory Visit: Payer: Self-pay | Admitting: *Deleted

## 2023-09-15 DIAGNOSIS — Z1231 Encounter for screening mammogram for malignant neoplasm of breast: Secondary | ICD-10-CM

## 2023-09-17 DIAGNOSIS — Z1231 Encounter for screening mammogram for malignant neoplasm of breast: Secondary | ICD-10-CM

## 2023-10-15 ENCOUNTER — Ambulatory Visit
Admission: RE | Admit: 2023-10-15 | Discharge: 2023-10-15 | Disposition: A | Discharge status: Home / Self Care | Payer: No Typology Code available for payment source | Source: Ambulatory Visit

## 2023-10-15 DIAGNOSIS — Z1231 Encounter for screening mammogram for malignant neoplasm of breast: Secondary | ICD-10-CM

## 2024-03-22 ENCOUNTER — Encounter: Payer: Self-pay | Admitting: Obstetrics & Gynecology

## 2024-03-22 ENCOUNTER — Ambulatory Visit: Admitting: Obstetrics & Gynecology

## 2024-03-22 VITALS — BP 138/87 | HR 70 | Ht 63.0 in | Wt 149.6 lb

## 2024-03-22 DIAGNOSIS — Z975 Presence of (intrauterine) contraceptive device: Secondary | ICD-10-CM | POA: Diagnosis not present

## 2024-03-22 DIAGNOSIS — Z8742 Personal history of other diseases of the female genital tract: Secondary | ICD-10-CM

## 2024-03-22 DIAGNOSIS — Z01419 Encounter for gynecological examination (general) (routine) without abnormal findings: Secondary | ICD-10-CM

## 2024-03-22 NOTE — Progress Notes (Signed)
 WELL-WOMAN EXAMINATION Patient name: Christina Donaldson MRN [id number]  Date of birth: [birth year] Chief Complaint:   Annual Exam  History of Present Illness:   Christina Donaldson is a 48 y.o. [phone] female being seen today for a routine well-woman exam.   In review, patient has history of endometriosis diagnosed by laparoscopy with Dr. Jacqueline Stains.  Mirena placed 2018 with short strings  Feb- Aug without a menses and then noted a few days of light spotting.  Notes hormonal migraines that are tolerable.  Denies pelvic or abdominal pain.  Denies vaginal discharge, itching or irritation.  Overall doing well from a gynecologic standpoint  Patient's last menstrual period was 03/03/2024 (exact date).  The current method of family planning is status post hysterectomy.    Last pap 02/2023 .  Last mammogram: 10/2023. Last colonoscopy: up to date     03/22/2024   10:50 AM 03/11/2023    8:32 AM  Depression screen PHQ 2/9  Decreased Interest 0 0  Down, Depressed, Hopeless 0 0  PHQ - 2 Score 0 0  Altered sleeping 1 1  Tired, decreased energy 1 1  Change in appetite 1 1  Feeling bad or failure about yourself  0 0  Trouble concentrating 1 1  Moving slowly or fidgety/restless 0 0  Suicidal thoughts 0 0  PHQ-9 Score 4 4      Review of Systems:   Pertinent items are noted in HPI Denies any headaches, blurred vision, fatigue, shortness of breath, chest pain, abdominal pain, bowel movements, urination, or intercourse unless otherwise stated above.  Pertinent History Reviewed:  Reviewed past medical,surgical, social and family history.  Reviewed problem list, medications and allergies. Physical Assessment:   Vitals:   03/22/24 1031  BP: 138/87  Pulse: 70  Weight: 149 lb 9.6 oz (67.9 kg)  Height: 5' 3 (1.6 m)  Body mass index is 26.5 kg/m.        Physical Examination:   General appearance - well appearing, and in no distress  Mental status - alert, oriented to person,  place, and time  Psych:  She has a normal mood and affect  Skin - warm and dry, normal color, no suspicious lesions noted  Chest - effort normal, all lung fields clear to auscultation bilaterally  Heart - normal rate and regular rhythm  Neck:  midline trachea, no thyromegaly or nodules  Breasts - breasts appear normal, no suspicious masses, no skin or nipple changes or  axillary nodes  Abdomen - soft, nontender, nondistended, no masses or organomegaly  Pelvic - VULVA: normal appearing vulva with no masses, tenderness or lesions  VAGINA: normal appearing vagina with normal color and discharge, no lesions  CERVIX: normal appearing cervix without discharge or lesions, no CMT. Strings visualized at os  UTERUS: uterus is felt to be normal size, shape, consistency and nontender   ADNEXA: No adnexal masses or tenderness noted.  Extremities:  No swelling or varicosities noted  Chaperone: Aleck Blase   Assessment & Plan:  1) Well-Woman Exam -Preventive screening exams up to date - Pap due 2028  2) contraception, endometriosis - Mirena replacement due next year []  Plan to replace and separate visit from annual with premedication  No orders of the defined types were placed in this encounter.   Meds: No orders of the defined types were placed in this encounter.   Follow-up: Return in about 1 year (around 03/22/2025) for Annual.   Lucky Alverson, DO Attending Obstetrician & Gynecologist, [facility]  [facility] for [employer], [facility]
